# Patient Record
Sex: Female | Born: 1980 | Race: Black or African American | Hispanic: No | Marital: Single | Smoking: Never smoker
Health system: Southern US, Community
[De-identification: ages and names within clinical notes are randomized; demographics above are authoritative.]

## PROBLEM LIST (undated history)

## (undated) HISTORY — PX: DENTAL SURGERY: SHX609

---

## 2002-12-30 ENCOUNTER — Emergency Department (HOSPITAL_COMMUNITY): Admission: EM | Admit: 2002-12-30 | Discharge: 2002-12-30 | Payer: Self-pay | Admitting: *Deleted

## 2009-08-24 ENCOUNTER — Emergency Department (HOSPITAL_COMMUNITY): Admission: EM | Admit: 2009-08-24 | Discharge: 2009-08-24 | Payer: Self-pay | Admitting: Emergency Medicine

## 2009-09-01 ENCOUNTER — Emergency Department (HOSPITAL_COMMUNITY): Admission: EM | Admit: 2009-09-01 | Discharge: 2009-09-01 | Payer: Self-pay | Admitting: Emergency Medicine

## 2010-09-27 LAB — URINALYSIS, ROUTINE W REFLEX MICROSCOPIC
Hgb urine dipstick: NEGATIVE
Nitrite: NEGATIVE
Urobilinogen, UA: 0.2 mg/dL (ref 0.0–1.0)

## 2014-06-10 ENCOUNTER — Emergency Department (HOSPITAL_COMMUNITY)
Admission: EM | Admit: 2014-06-10 | Discharge: 2014-06-10 | Disposition: A | Discharge status: Home / Self Care | Payer: Self-pay | Attending: Emergency Medicine | Admitting: Emergency Medicine

## 2014-06-10 ENCOUNTER — Encounter (HOSPITAL_COMMUNITY): Payer: Self-pay | Admitting: Cardiology

## 2014-06-10 ENCOUNTER — Emergency Department (HOSPITAL_COMMUNITY): Payer: Self-pay

## 2014-06-10 DIAGNOSIS — R61 Generalized hyperhidrosis: Secondary | ICD-10-CM | POA: Insufficient documentation

## 2014-06-10 DIAGNOSIS — M549 Dorsalgia, unspecified: Secondary | ICD-10-CM | POA: Insufficient documentation

## 2014-06-10 DIAGNOSIS — R0602 Shortness of breath: Secondary | ICD-10-CM | POA: Insufficient documentation

## 2014-06-10 DIAGNOSIS — I493 Ventricular premature depolarization: Secondary | ICD-10-CM | POA: Insufficient documentation

## 2014-06-10 DIAGNOSIS — R079 Chest pain, unspecified: Secondary | ICD-10-CM | POA: Insufficient documentation

## 2014-06-10 LAB — CBC WITH DIFFERENTIAL/PLATELET
BASOS PCT: 0 % (ref 0–1)
Basophils Absolute: 0 10*3/uL (ref 0.0–0.1)
EOS ABS: 0.1 10*3/uL (ref 0.0–0.7)
Eosinophils Relative: 1 % (ref 0–5)
HEMATOCRIT: 37.6 % (ref 36.0–46.0)
HEMOGLOBIN: 13 g/dL (ref 12.0–15.0)
Lymphocytes Relative: 29 % (ref 12–46)
Lymphs Abs: 1.7 10*3/uL (ref 0.7–4.0)
MCH: 29 pg (ref 26.0–34.0)
MCHC: 34.6 g/dL (ref 30.0–36.0)
MCV: 83.7 fL (ref 78.0–100.0)
Monocytes Absolute: 0.5 10*3/uL (ref 0.1–1.0)
Monocytes Relative: 9 % (ref 3–12)
NEUTROS PCT: 61 % (ref 43–77)
Neutro Abs: 3.6 10*3/uL (ref 1.7–7.7)
PLATELETS: 263 10*3/uL (ref 150–400)
RBC: 4.49 MIL/uL (ref 3.87–5.11)
RDW: 13.1 % (ref 11.5–15.5)
WBC: 5.9 10*3/uL (ref 4.0–10.5)

## 2014-06-10 LAB — BASIC METABOLIC PANEL
Anion gap: 16 — ABNORMAL HIGH (ref 5–15)
BUN: 14 mg/dL (ref 6–23)
CHLORIDE: 101 meq/L (ref 96–112)
CO2: 23 mEq/L (ref 19–32)
Calcium: 10.1 mg/dL (ref 8.4–10.5)
Creatinine, Ser: 0.78 mg/dL (ref 0.50–1.10)
GFR calc Af Amer: >90 mL/min (ref >90)
GFR calc non Af Amer: >90 mL/min (ref >90)
Glucose, Bld: 85 mg/dL (ref 70–99)
Potassium: 3.5 mEq/L — ABNORMAL LOW (ref 3.7–5.3)
SODIUM: 140 meq/L (ref 137–147)

## 2014-06-10 LAB — D-DIMER, QUANTITATIVE (NOT AT ARMC): D DIMER QUANT: 0.39 ug{FEU}/mL (ref 0.00–0.48)

## 2014-06-10 LAB — TROPONIN I

## 2014-06-10 LAB — I-STAT BETA HCG BLOOD, ED (MC, WL, AP ONLY)

## 2014-06-10 LAB — HCG, SERUM, QUALITATIVE: PREG SERUM: NEGATIVE

## 2014-06-10 MED ORDER — HYDROXYZINE HCL 25 MG PO TABS: 25.0000 mg | ORAL_TABLET | Freq: Every evening | ORAL | Status: DC | PRN

## 2014-06-10 NOTE — ED Notes (Addendum)
Left sided breast pain off and on since thanksgiving.  States she feels like her left breast is larger than the right.

## 2014-06-10 NOTE — Discharge Instructions (Signed)
No caffeine. Call Cardiology office for appointment.  Holter Monitoring A Holter monitor is a small device with electrodes (small sticky patches) that attach to your chest. It records the electrical activity of your heart and is worn continuously for 24-48 hours.  A HOLTER MONITOR IS USED TO  Detect heart problems such as:  Heart arrhythmia. Is an abnormal or irregular heartbeat. With some heart arrhythmias, you may not feel or know that you have an irregular heart rhythm.  Palpitations, such as feeling your heart racing or fluttering. It is possible to have heart palpitations and not have a heart arrhythmia.  A heart rhythm that is too slow or too fast.  If you have problems fainting, near fainting or feeling light-headed, a Holter monitor may be worn to see if your heart is the cause. HOLTER MONITOR PREPARATION   Electrodes will be attached to the skin on your chest.  If you have hair on your chest, small areas may have to be shaved. This is done to help the patches stick better and make the recording more accurate.  The electrodes are attached by wires to the Holter monitor. The Holter monitor clips to your clothing. You will wear the monitor at all times, even while exercising and sleeping. HOME CARE INSTRUCTIONS   Wear your monitor at all times.  The wires and the monitor must stay dry. Do not get the monitor wet.  Do not bathe, swim or use a hot tub with it on.  You may do a "sponge" bath while you have the monitor on.  Keep your skin clean, do not put body lotion or moisturizer on your chest.  It's possible that your skin under the electrodes could become irritated. To keep this from happening, you may put the electrodes in slightly different places on your chest.  Your caregiver will also ask you to keep a diary of your activities, such as walking or doing chores. Be sure to note what you are doing if you experience heart symptoms such as palpitations. This will help your  caregiver determine what might be contributing to your symptoms. The information stored in your monitor will be reviewed by your caregiver alongside your diary entries.  Make sure the monitor is safely clipped to your clothing or in a location close to your body that your caregiver recommends.  The monitor and electrodes are removed when the test is over. Return the monitor as directed.  Be sure to follow up with your caregiver and discuss your Holter monitor results. SEEK IMMEDIATE MEDICAL CARE IF:  You faint or feel lightheaded.  You have trouble breathing.  You get pain in your chest, upper arm or jaw.  You feel sick to your stomach and your skin is pale, cool, or damp.  You think something is wrong with the way your heart is beating. MAKE SURE YOU:   Understand these instructions.  Will watch your condition.  Will get help right away if you are not doing well or get worse. Document Released: 03/23/2004 Document Revised: 09/17/2011 Document Reviewed: 08/05/2008 [facility] CenterExitCare Patient Information 2015 [place], [place]. This information is not intended to replace advice given to you by your health care provider. Make sure you discuss any questions you have with your health care provider.  Premature Beats A premature beat is an extra heartbeat that happens earlier than normal. Premature beats are called premature atrial contractions (PACs) or premature ventricular contractions (PVCs) depending on the area of the heart where they start. CAUSES  Premature beats may be brought on by a variety of factors including:  Emotional stress.  Lack of sleep.  Caffeine.  Asthma medicines.  Stimulants.  Herbal teas.  Dietary supplements.  Alcohol. In most cases, premature beats are not dangerous and are not a sign of serious heart disease. Most patients evaluated for premature beats have completely normal heart function. Rarely, premature beats may be a sign of more significant heart  problems or medical illness. SYMPTOMS  Premature beats may cause palpitations. This means you feel like your heart is skipping a beat or beating harder than usual. Sometimes, slight chest pain occurs with premature beats, lasting only a few seconds. This pain has been described as a "flopping" feeling inside the chest. In many cases, premature beats do not cause any symptoms and they are only detected when an electrocardiography test (EKG) or heart monitoring is performed. DIAGNOSIS  Your caregiver may run some tests to evaluate your heart such as an EKG or echocardiography. You may need to wear a portable heart monitor for several days to record the electrical activity of your heart. Blood testing may also be performed to check your electrolytes and thyroid function. TREATMENT  Premature beats usually go away with rest. If the problem continues, your caregiver will determine a treatment plan for you.  HOME CARE INSTRUCTIONS  Get plenty of rest over the next few days until your symptoms improve.  Avoid coffee, tea, alcohol, and soda (pop, cola).  Do not smoke. SEEK MEDICAL CARE IF:  Your symptoms continue after 1 to 2 days of rest.  You have new symptoms, such as chest pain or trouble breathing. SEEK IMMEDIATE MEDICAL CARE IF:  You have severe chest pain or abdominal pain.  You have pain that radiates into the neck, arm, or jaw.  You faint or have extreme weakness.  You have shortness of breath.  Your heartbeat races for more than 5 seconds. MAKE SURE YOU:  Understand these instructions.  Will watch your condition.  Will get help right away if you are not doing well or get worse. Document Released: 08/02/2004 Document Revised: 09/17/2011 Document Reviewed: 02/26/2011 [facility] Patient Information 2015 [place], [place]. This information is not intended to replace advice given to you by your health care provider. Make sure you discuss any questions you have with your health care  provider.  Premature Ventricular Contraction Premature ventricular contraction (PVC) is an irregularity of the heart rhythm involving extra or skipped heartbeats. In some cases, they may occur without obvious cause or heart disease. Other times, they can be caused by an electrolyte change in the blood. These need to be corrected. They can also be seen when there is not enough oxygen going to the heart. A common cause of this is plaque or cholesterol buildup. This buildup decreases the blood supply to the heart. In addition, extra beats may be caused or aggravated by:  Excessive smoking.  Alcohol consumption.  Caffeine.  Certain medications  Some street drugs. SYMPTOMS   The sensation of feeling your heart skipping a beat (palpitations).  In many cases, the person may have no symptoms. SIGNS AND TESTS   A physical examination may show an occasional irregularity, but if the PVC beats do not happen often, they may not be found on physical exam.  Blood pressure is usually normal.  Other tests that may find extra beats of the heart are:  An EKG (electrocardiogram)  A Holter monitor which can monitor your heart over longer periods of time  An Angiogram (study of the heart arteries). TREATMENT  Usually extra heartbeats do not need treatment. The condition is treated only if symptoms are severe or if extra beats are very frequent or are causing problems. An underlying cause, if discovered, may also require treatment.  Treatment may also be needed if there may be a risk for other more serious cardiac arrhythmias.  PREVENTION   Moderation in caffeine, alcohol, and tobacco use may reduce the risk of ectopic heartbeats in some people.  Exercise often helps people who lead a sedentary (inactive) lifestyle. PROGNOSIS  PVC heartbeats are generally harmless and do not need treatment.  RISKS AND COMPLICATIONS   Ventricular tachycardia (occasionally).  There usually are no  complications.  Other arrhythmias (occasionally). SEEK IMMEDIATE MEDICAL CARE IF:   You feel palpitations that are frequent or continual.  You develop chest pain or other problems such as shortness of breath, sweating, or nausea and vomiting.  You become light-headed or faint (pass out).  You get worse or do not improve with treatment. Document Released: 02/10/2004 Document Revised: 09/17/2011 Document Reviewed: 08/22/2007 [facility]Exit[facility] Patient Information 2015 [place], [place]. This information is not intended to replace advice given to you by your health care provider. Make sure you discuss any questions you have with your health care provider.

## 2014-06-10 NOTE — ED Provider Notes (Signed)
CSN: [id number]     Arrival date & time 06/10/14  1823 History  This chart was scribed for Rolland PorterMark Suni Jarnagin, MD by Gwenyth Oberatherine Macek, ED Scribe. This patient was seen in room APA05/AP[facility]05 and the patient's care was started at 6:53 PM.    Chief Complaint  Patient presents with  . Chest Pain   The history is provided by the patient. No language interpreter was used.    HPI Comments: Autumn Stewart is a 33 y.o. female who presents to the Emergency Department complaining of constant chest palpitations that she describes as irregular and started 1 week ago. She notes SOB with exertion and diaphoresis at night as associated symptoms. Pt denies taking prescription medications, birth control use, a history of asthma, recent drug abuse and a family history of CAD. She states she used to smoke, but quit 1 month ago. She notes occasional, but infrequent caffeine consumption. Pt has been under a lot of stress recently and has had decreased sleep in the last 4 days.   Pt also complains of intermittent left breast pain that started 1 week ago and intermittent, mild upper back pain that started 1 month ago. She states that she sleeps on her left side and that pain seems to decrease during the day. She denies a history of breast issues or masses in her breast. She also denies nausea, vomiting and fever as associated symptoms.   History reviewed. No pertinent past medical history. History reviewed. No pertinent past surgical history. History reviewed. No pertinent family history. History  Substance Use Topics  . Smoking status: Never Smoker   . Smokeless tobacco: Not on file  . Alcohol Use: No   OB History    No data available     Review of Systems  Constitutional: Positive for diaphoresis. Negative for fever, chills, appetite change and fatigue.  HENT: Negative for mouth sores, sore throat and trouble swallowing.   Eyes: Negative for visual disturbance.  Respiratory: Positive for shortness of breath. Negative  for cough, chest tightness and wheezing.   Cardiovascular: Positive for palpitations. Negative for chest pain.  Gastrointestinal: Negative for nausea, vomiting, abdominal pain, diarrhea and abdominal distention.  Endocrine: Negative for polydipsia, polyphagia and polyuria.  Genitourinary: Negative for dysuria, frequency and hematuria.  Musculoskeletal: Positive for back pain and arthralgias. Negative for gait problem.  Skin: Negative for color change, pallor and rash.  Neurological: Negative for dizziness, syncope, light-headedness and headaches.  Hematological: Does not bruise/bleed easily.  Psychiatric/Behavioral: Negative for behavioral problems and confusion.    Allergies  Review of patient's allergies indicates no known allergies.  Home Medications   Prior to Admission medications   Medication Sig Start Date End Date Taking? Authorizing Provider  ibuprofen (ADVIL,MOTRIN) 200 MG tablet Take 200 mg by mouth every 6 (six) hours as needed for mild pain or moderate pain.   Yes Historical Provider, MD  hydrOXYzine (ATARAX/VISTARIL) 25 MG tablet Take 1 tablet (25 mg total) by mouth at bedtime as needed (sleep). 06/10/14   Rolland PorterMark Kiante Petrovich, MD   BP 142/98 mmHg  Pulse 82  Temp(Src) 98.2 F (36.8 C) (Oral)  Resp 15  Ht 5\' 4"  (1.626 m)  Wt 116 lb (52.617 kg)  BMI 19.90 kg/m2  SpO2 100%  LMP 05/11/2014   Physical Exam  Constitutional: She is oriented to person, place, and time. She appears well-developed and well-nourished. No distress.  HENT:  Head: Normocephalic.  Eyes: Conjunctivae are normal. Pupils are equal, round, and reactive to light. No scleral  icterus.  Neck: Normal range of motion. Neck supple. No thyromegaly present.  Cardiovascular: Normal rate.  Exam reveals no gallop and no friction rub.   No murmur heard. Occasional irregular beat with trigeminy  Pulmonary/Chest: Effort normal and breath sounds normal. No respiratory distress. She has no wheezes. She has no rales.   Abdominal: Soft. Bowel sounds are normal. She exhibits no distension. There is no tenderness. There is no rebound.  Musculoskeletal: Normal range of motion. She exhibits tenderness.  Infrascapular tenderness  Neurological: She is alert and oriented to person, place, and time.  Skin: Skin is warm and dry. No rash noted.  Psychiatric: She has a normal mood and affect. Her behavior is normal.  Nursing note and vitals reviewed.  Symmetric breast exam done with chaperone in the room. No masses no discharge no asymmetry no erythema nontender.  ED Course  Procedures (including critical care time) DIAGNOSTIC STUDIES: Oxygen Saturation is 100% on RA, normal by my interpretation.    COORDINATION OF CARE: 7:02 PM Discussed treatment plan with pt which includes lab work. Pt agreed to plan.  Labs Review Labs Reviewed  BASIC METABOLIC PANEL - Abnormal; Notable for the following:    Potassium 3.5 (*)    Anion gap 16 (*)    All other components within normal limits  CBC WITH DIFFERENTIAL  D-DIMER, QUANTITATIVE  TROPONIN I  HCG, SERUM, QUALITATIVE  TSH  I-STAT BETA HCG BLOOD, ED ([facility], [facility], [facility] ONLY)    Imaging Review No results found.   EKG Interpretation   Date/Time:  Thursday June 10 2014 18:45:56 EST Ventricular Rate:  80 PR Interval:  138 QRS Duration: 71 QT Interval:  364 QTC Calculation: 420 R Axis:   82 Text Interpretation:  Sinus rhythm Ventricular trigeminy Confirmed by  Fayrene FearingJAMES  MD, Yenty Bloch ([id number]) on 06/10/2014 7:58:24 PM      MDM   Final diagnoses:  PVC's (premature ventricular contractions)    Patient states she's been stressed because she been trying to find a job. That's why she stopped smoking. Denies over-the-counter stimulants with cough or cold medicine. Denies illicit drug use. She is joint not slept well for 5 or 6 days and her PVCs have gotten more frequent. Given her Atarax to use for sleep. With our cardiology for follow-up and possible Holter monitoring.  Recheck with any worsening symptoms.  I personally performed the services described in this documentation, which was scribed in my presence. The recorded information has been reviewed and is accurate.    Rolland PorterMark Cree Kunert, MD 06/10/14 2020

## 2014-06-11 LAB — TSH: TSH: 1.5 u[IU]/mL (ref 0.350–4.500)

## 2014-06-23 ENCOUNTER — Encounter: Payer: Self-pay | Admitting: Cardiology

## 2014-06-23 DIAGNOSIS — I493 Ventricular premature depolarization: Secondary | ICD-10-CM | POA: Insufficient documentation

## 2014-06-23 DIAGNOSIS — R002 Palpitations: Secondary | ICD-10-CM | POA: Insufficient documentation

## 2014-06-23 NOTE — Progress Notes (Signed)
No show  This encounter was created in error - please disregard.

## 2014-10-13 ENCOUNTER — Emergency Department (HOSPITAL_COMMUNITY)
Admission: EM | Admit: 2014-10-13 | Discharge: 2014-10-13 | Disposition: A | Discharge status: Home / Self Care | Payer: Self-pay | Attending: Emergency Medicine | Admitting: Emergency Medicine

## 2014-10-13 ENCOUNTER — Encounter (HOSPITAL_COMMUNITY): Payer: Self-pay | Admitting: Emergency Medicine

## 2014-10-13 DIAGNOSIS — Z79899 Other long term (current) drug therapy: Secondary | ICD-10-CM | POA: Insufficient documentation

## 2014-10-13 DIAGNOSIS — K047 Periapical abscess without sinus: Secondary | ICD-10-CM | POA: Insufficient documentation

## 2014-10-13 DIAGNOSIS — Z791 Long term (current) use of non-steroidal anti-inflammatories (NSAID): Secondary | ICD-10-CM | POA: Insufficient documentation

## 2014-10-13 DIAGNOSIS — K029 Dental caries, unspecified: Secondary | ICD-10-CM | POA: Insufficient documentation

## 2014-10-13 MED ORDER — TRAMADOL HCL 50 MG PO TABS: 50.0000 mg | ORAL_TABLET | Freq: Four times a day (QID) | ORAL | Status: DC | PRN

## 2014-10-13 MED ORDER — AMOXICILLIN 500 MG PO CAPS: 500.0000 mg | ORAL_CAPSULE | Freq: Three times a day (TID) | ORAL | Status: DC

## 2014-10-13 NOTE — Discharge Instructions (Signed)
Dental Abscess °A dental abscess is a collection of infected fluid (pus) from a bacterial infection in the inner part of the tooth (pulp). It usually occurs at the end of the tooth's root.  °CAUSES  °· Severe tooth decay. °· Trauma to the tooth that allows bacteria to enter into the pulp, such as a broken or chipped tooth. °SYMPTOMS  °· Severe pain in and around the infected tooth. °· Swelling and redness around the abscessed tooth or in the mouth or face. °· Tenderness. °· Pus drainage. °· Bad breath. °· Bitter taste in the mouth. °· Difficulty swallowing. °· Difficulty opening the mouth. °· Nausea. °· Vomiting. °· Chills. °· Swollen neck glands. °DIAGNOSIS  °· A medical and dental history will be taken. °· An examination will be performed by tapping on the abscessed tooth. °· X-rays may be taken of the tooth to identify the abscess. °TREATMENT °The goal of treatment is to eliminate the infection. You may be prescribed antibiotic medicine to stop the infection from spreading. A root canal may be performed to save the tooth. If the tooth cannot be saved, it may be pulled (extracted) and the abscess may be drained.  °HOME CARE INSTRUCTIONS °· Only take over-the-counter or prescription medicines for pain, fever, or discomfort as directed by your caregiver. °· Rinse your mouth (gargle) often with salt water (¼ tsp salt in 8 oz [250 ml] of warm water) to relieve pain or swelling. °· Do not drive after taking pain medicine (narcotics). °· Do not apply heat to the outside of your face. °· Return to your dentist for further treatment as directed. °SEEK MEDICAL CARE IF: °· Your pain is not helped by medicine. °· Your pain is getting worse instead of better. °SEEK IMMEDIATE MEDICAL CARE IF: °· You have a fever or persistent symptoms for more than 2-3 days. °· You have a fever and your symptoms suddenly get worse. °· You have chills or a very bad headache. °· You have problems breathing or swallowing. °· You have trouble  opening your mouth. °· You have swelling in the neck or around the eye. °Document Released: 06/25/2005 Document Revised: 03/19/2012 Document Reviewed: 10/03/2010 °ExitCare® Patient Information ©2015 [employer]. This information is not intended to replace advice given to you by your health care provider. Make sure you discuss any questions you have with your health care provider. ° ° °Complete your entire course of antibiotics as prescribed.  You  may use the tramadol for pain relief but do not drive within 4 hours of taking as this will make you drowsy.  Avoid applying heat or ice to this abscess area which can worsen your symptoms.  You may use warm salt water swish and spit treatment or half peroxide and water swish and spit after meals to keep this area clean as discussed.  Call the dentist listed above for further management of your symptoms. ° ° °

## 2014-10-13 NOTE — ED Provider Notes (Signed)
CSN: [id number]     Arrival date & time 10/13/14  1446 History   First MD Initiated Contact with Patient 10/13/14 1512     Chief Complaint  Patient presents with  . Dental Pain     (Consider location/radiation/quality/duration/timing/severity/associated sxs/prior Treatment) Patient is a 34 y.o. female presenting with tooth pain. The history is provided by the patient.  Dental Pain Location:  Lower and upper Upper teeth location:  2/RU 2nd molar Lower teeth location:  30/RL 1st molar and 18/LL 2nd molar Quality:  Aching Severity:  Moderate Onset quality:  Gradual Duration:  2 weeks Timing:  Constant Progression:  Worsening Chronicity:  Recurrent Context: dental caries and poor dentition   Previous work-up:  Dental exam (has had several extractions when was covered by insurance, now unable to see dentistry.) Relieved by:  Nothing Worsened by:  Nothing tried Ineffective treatments:  Acetaminophen and NSAIDs Associated symptoms: gum swelling   Associated symptoms: no facial swelling, no fever, no neck pain, no neck swelling, no oral bleeding and no trismus   Associated symptoms comment:  Drainage from site along right lower gum line which drains pus when pressed.   History reviewed. No pertinent past medical history. History reviewed. No pertinent past surgical history. Family History  Problem Relation Age of Onset  . Diabetes Other   . Hypertension Other    History  Substance Use Topics  . Smoking status: Never Smoker   . Smokeless tobacco: Never Used  . Alcohol Use: No   OB History    No data available     Review of Systems  Constitutional: Negative for fever.  HENT: Positive for dental problem. Negative for facial swelling and sore throat.   Respiratory: Negative for shortness of breath.   Musculoskeletal: Negative for neck pain and neck stiffness.      Allergies  Review of patient's allergies indicates no known allergies.  Home Medications   Prior to  Admission medications   Medication Sig Start Date End Date Taking? Authorizing Provider  ibuprofen (ADVIL,MOTRIN) 200 MG tablet Take 200 mg by mouth every 6 (six) hours as needed for mild pain or moderate pain.   Yes Historical Provider, MD  naproxen sodium (ANAPROX) 220 MG tablet Take 440 mg by mouth daily as needed (pain).   Yes Historical Provider, MD  amoxicillin (AMOXIL) 500 MG capsule Take 1 capsule (500 mg total) by mouth 3 (three) times daily. 10/13/14   Burgess Amor, PA-C  hydrOXYzine (ATARAX/VISTARIL) 25 MG tablet Take 1 tablet (25 mg total) by mouth at bedtime as needed (sleep). Patient not taking: Reported on 10/13/2014 06/10/14   Rolland Porter, MD  traMADol (ULTRAM) 50 MG tablet Take 1 tablet (50 mg total) by mouth every 6 (six) hours as needed. 10/13/14   Burgess Amor, PA-C   BP 135/81 mmHg  Pulse 73  Temp(Src) 98.7 F (37.1 C) (Oral)  Resp 18  Ht  (1.626 m)  Wt 120 lb (54.432 kg)  BMI 20.59 kg/m2  SpO2 100%  LMP 09/29/2014 Physical Exam  Constitutional: She is oriented to person, place, and time. She appears well-developed and well-nourished. No distress.  HENT:  Head: Normocephalic and atraumatic.  Right Ear: Tympanic membrane and external ear normal.  Left Ear: Tympanic membrane and external ear normal.  Mouth/Throat: Oropharynx is clear and moist and mucous membranes are normal. No oral lesions. Dental caries present.    Eyes: Conjunctivae are normal.  Neck: Normal range of motion. Neck supple.  Cardiovascular: Normal rate and  normal heart sounds.   Pulmonary/Chest: Effort normal.  Abdominal: She exhibits no distension.  Musculoskeletal: Normal range of motion.  Lymphadenopathy:    She has no cervical adenopathy.  Neurological: She is alert and oriented to person, place, and time.  Skin: Skin is warm and dry. No erythema.  Psychiatric: She has a normal mood and affect.    ED Course  Procedures (including critical care time) Labs Review Labs Reviewed - No data to  display  Imaging Review No results found.   EKG Interpretation None      MDM   Final diagnoses:  Dental abscess  Dental decay    Pt with complicated dental pain/cavities (multiple) with purulent drainage from gingiva.  Amoxil,  Tramadol, dental referrals given. she has already contacted the free clinic, will also give her the health department is another [facility] resource.    Burgess AmorJulie Fletcher Rathbun, PA-C 10/13/14 1540  Kristen N Ward, DO 10/13/14 1754

## 2014-10-13 NOTE — ED Notes (Signed)
Pt reports lower jaw dental pain x 2 1/2 weeks.

## 2014-12-07 ENCOUNTER — Encounter (HOSPITAL_COMMUNITY): Payer: Self-pay | Admitting: Emergency Medicine

## 2014-12-07 ENCOUNTER — Emergency Department (HOSPITAL_COMMUNITY)
Admission: EM | Admit: 2014-12-07 | Discharge: 2014-12-07 | Disposition: A | Discharge status: Home / Self Care | Payer: Self-pay | Attending: Emergency Medicine | Admitting: Emergency Medicine

## 2014-12-07 DIAGNOSIS — K029 Dental caries, unspecified: Secondary | ICD-10-CM | POA: Insufficient documentation

## 2014-12-07 DIAGNOSIS — Z792 Long term (current) use of antibiotics: Secondary | ICD-10-CM | POA: Insufficient documentation

## 2014-12-07 DIAGNOSIS — K088 Other specified disorders of teeth and supporting structures: Secondary | ICD-10-CM | POA: Insufficient documentation

## 2014-12-07 DIAGNOSIS — K0889 Other specified disorders of teeth and supporting structures: Secondary | ICD-10-CM

## 2014-12-07 MED ORDER — ACETAMINOPHEN-CODEINE #3 300-30 MG PO TABS: 1.0000 | ORAL_TABLET | Freq: Four times a day (QID) | ORAL | Status: DC | PRN

## 2014-12-07 MED ORDER — AMOXICILLIN 500 MG PO CAPS: 500.0000 mg | ORAL_CAPSULE | Freq: Three times a day (TID) | ORAL | Status: DC

## 2014-12-07 NOTE — ED Notes (Signed)
Pt has broken tooth on lower right side causing pain

## 2014-12-07 NOTE — ED Notes (Signed)
Pt verbalized understanding of no driving and to use caution within 4 hours of taking pain meds due to meds cause drowsiness 

## 2014-12-07 NOTE — ED Provider Notes (Signed)
CSN: [id number]     Arrival date & time 12/07/14  1448 History   First MD Initiated Contact with Patient 12/07/14 1516     Chief Complaint  Patient presents with  . Dental Pain     (Consider location/radiation/quality/duration/timing/severity/associated sxs/prior Treatment) HPI  Autumn Stewart is a 34 y.o. female who presents to the Emergency Department complaining of dental pain for several days.  She reports pain to her right lower teeth.  She also reports intermittent bleeding to her gums and a "bad tasting drainage"  She denies fever, chills, and facial swelling. She states that she doesn't have the money to afford to see a dentist.  She has not taken any medications for pain.      History reviewed. No pertinent past medical history. History reviewed. No pertinent past surgical history. Family History  Problem Relation Age of Onset  . Diabetes Other   . Hypertension Other    History  Substance Use Topics  . Smoking status: Never Smoker   . Smokeless tobacco: Never Used  . Alcohol Use: No   OB History    No data available     Review of Systems  Constitutional: Negative for fever and appetite change.  HENT: Positive for dental problem. Negative for congestion, facial swelling, sore throat and trouble swallowing.   Eyes: Negative for pain and visual disturbance.  Gastrointestinal: Negative for nausea and vomiting.  Musculoskeletal: Negative for neck pain and neck stiffness.  Neurological: Negative for dizziness, facial asymmetry and headaches.  Hematological: Negative for adenopathy.  All other systems reviewed and are negative.     Allergies  Review of patient's allergies indicates no known allergies.  Home Medications   Prior to Admission medications   Medication Sig Start Date End Date Taking? Authorizing Provider  amoxicillin (AMOXIL) 500 MG capsule Take 1 capsule (500 mg total) by mouth 3 (three) times daily. 10/13/14   Burgess AmorJulie Idol, PA-C  hydrOXYzine  (ATARAX/VISTARIL) 25 MG tablet Take 1 tablet (25 mg total) by mouth at bedtime as needed (sleep). Patient not taking: Reported on 10/13/2014 06/10/14   Rolland PorterMark James, MD  ibuprofen (ADVIL,MOTRIN) 200 MG tablet Take 200 mg by mouth every 6 (six) hours as needed for mild pain or moderate pain.    Historical Provider, MD  naproxen sodium (ANAPROX) 220 MG tablet Take 440 mg by mouth daily as needed (pain).    Historical Provider, MD  traMADol (ULTRAM) 50 MG tablet Take 1 tablet (50 mg total) by mouth every 6 (six) hours as needed. 10/13/14   Burgess AmorJulie Idol, PA-C   BP 123/80 mmHg  Pulse 69  Temp(Src) 98.3 F (36.8 C) (Oral)  Resp 12  SpO2 99%  LMP 11/06/2014 Physical Exam  Constitutional: She is oriented to person, place, and time. She appears well-developed and well-nourished. No distress.  HENT:  Head: Normocephalic and atraumatic.  Right Ear: Tympanic membrane and ear canal normal.  Left Ear: Tympanic membrane and ear canal normal.  Mouth/Throat: Uvula is midline, oropharynx is clear and moist and mucous membranes are normal. No trismus in the jaw. Dental caries present. No dental abscesses or uvula swelling.    tenderness to palpation at the right lower second premolar and multiple dental caries.   No facial swelling, obvious dental abscess, trismus, or sublingual abnml.    Neck: Normal range of motion. Neck supple.  Cardiovascular: Normal rate, regular rhythm and normal heart sounds.   No murmur heard. Pulmonary/Chest: Effort normal and breath sounds normal.  Musculoskeletal: Normal  range of motion.  Lymphadenopathy:    She has no cervical adenopathy.  Neurological: She is alert and oriented to person, place, and time. She exhibits normal muscle tone. Coordination normal.  Skin: Skin is warm and dry.  Nursing note and vitals reviewed.   ED Course  Procedures (including critical care time) Labs Review Labs Reviewed - No data to display  Imaging Review No results found.   EKG  Interpretation None      MDM   Final diagnoses:  Pain, dental   Pt is well appearing.  Non-toxic.  No concerning sx's for Ludwig's angina.  Pt given referral info.  Agrees to arrange f/u     Pauline Aus, PA-C 12/07/14 1630  Vanetta Mulders, MD 12/09/14 [phone]

## 2014-12-07 NOTE — Discharge Instructions (Signed)
Dental Pain  Toothache is pain in or around a tooth. It may get worse with chewing or with cold or heat.   HOME CARE  · Your dentist may use a numbing medicine during treatment. If so, you may need to avoid eating until the medicine wears off. Ask your dentist about this.  · Only take medicine as told by your dentist or doctor.  · Avoid chewing food near the painful tooth until after all treatment is done. Ask your dentist about this.  GET HELP RIGHT AWAY IF:   · The problem gets worse or new problems appear.  · You have a fever.  · There is redness and puffiness (swelling) of the face, jaw, or neck.  · You cannot open your mouth.  · There is pain in the jaw.  · There is very bad pain that is not helped by medicine.  MAKE SURE YOU:   · Understand these instructions.  · Will watch your condition.  · Will get help right away if you are not doing well or get worse.  Document Released: 12/12/2007 Document Revised: 09/17/2011 Document Reviewed: 12/12/2007  [employer]® Patient Information ©2015 [employer]. This information is not intended to replace advice given to you by your health care provider. Make sure you discuss any questions you have with your health care provider.

## 2015-12-27 ENCOUNTER — Emergency Department (HOSPITAL_COMMUNITY)
Admission: EM | Admit: 2015-12-27 | Discharge: 2015-12-27 | Disposition: A | Discharge status: Home / Self Care | Payer: Self-pay | Attending: Emergency Medicine | Admitting: Emergency Medicine

## 2015-12-27 ENCOUNTER — Encounter (HOSPITAL_COMMUNITY): Payer: Self-pay | Admitting: Emergency Medicine

## 2015-12-27 DIAGNOSIS — M545 Low back pain, unspecified: Secondary | ICD-10-CM

## 2015-12-27 DIAGNOSIS — R51 Headache: Secondary | ICD-10-CM | POA: Insufficient documentation

## 2015-12-27 DIAGNOSIS — Z791 Long term (current) use of non-steroidal anti-inflammatories (NSAID): Secondary | ICD-10-CM | POA: Insufficient documentation

## 2015-12-27 DIAGNOSIS — Z79899 Other long term (current) drug therapy: Secondary | ICD-10-CM | POA: Insufficient documentation

## 2015-12-27 DIAGNOSIS — K029 Dental caries, unspecified: Secondary | ICD-10-CM | POA: Insufficient documentation

## 2015-12-27 DIAGNOSIS — K0889 Other specified disorders of teeth and supporting structures: Secondary | ICD-10-CM

## 2015-12-27 MED ORDER — AMOXICILLIN 500 MG PO CAPS: 500.0000 mg | ORAL_CAPSULE | Freq: Three times a day (TID) | ORAL | Status: AC

## 2015-12-27 MED ORDER — DICLOFENAC SODIUM 50 MG PO TBEC: 50.0000 mg | DELAYED_RELEASE_TABLET | Freq: Two times a day (BID) | ORAL | Status: AC

## 2015-12-27 NOTE — ED Notes (Signed)
PT c/o right lower dental pain x3 days.

## 2015-12-27 NOTE — Discharge Instructions (Signed)
Back Pain, Adult °Back pain is very common in adults. The cause of back pain is rarely dangerous and the pain often gets better over time. The cause of your back pain may not be known. Some common causes of back pain include: °· Strain of the muscles or ligaments supporting the spine. °· Wear and tear (degeneration) of the spinal disks. °· Arthritis. °· Direct injury to the back. °For many people, back pain may return. Since back pain is rarely dangerous, most people can learn to manage this condition on their own. °HOME CARE INSTRUCTIONS °Watch your back pain for any changes. The following actions may help to lessen any discomfort you are feeling: °· Remain active. It is stressful on your back to sit or stand in one place for long periods of time. Do not sit, drive, or stand in one place for more than 30 minutes at a time. Take short walks on even surfaces as soon as you are able. Try to increase the length of time you walk each day. °· Exercise regularly as directed by your health care provider. Exercise helps your back heal faster. It also helps avoid future injury by keeping your muscles strong and flexible. °· Do not stay in bed. Resting more than 1-2 days can delay your recovery. °· Pay attention to your body when you bend and lift. The most comfortable positions are those that put less stress on your recovering back. Always use proper lifting techniques, including: °· Bending your knees. °· Keeping the load close to your body. °· Avoiding twisting. °· Find a comfortable position to sleep. Use a firm mattress and lie on your side with your knees slightly bent. If you lie on your back, put a pillow under your knees. °· Avoid feeling anxious or stressed. Stress increases muscle tension and can worsen back pain. It is important to recognize when you are anxious or stressed and learn ways to manage it, such as with exercise. °· Take medicines only as directed by your health care provider. Over-the-counter  medicines to reduce pain and inflammation are often the most helpful. Your health care provider may prescribe muscle relaxant drugs. These medicines help dull your pain so you can more quickly return to your normal activities and healthy exercise. °· Apply ice to the injured area: °· Put ice in a plastic bag. °· Place a towel between your skin and the bag. °· Leave the ice on for 20 minutes, 2-3 times a day for the first 2-3 days. After that, ice and heat may be alternated to reduce pain and spasms. °· Maintain a healthy weight. Excess weight puts extra stress on your back and makes it difficult to maintain good posture. °SEEK MEDICAL CARE IF: °· You have pain that is not relieved with rest or medicine. °· You have increasing pain going down into the legs or buttocks. °· You have pain that does not improve in one week. °· You have night pain. °· You lose weight. °· You have a fever or chills. °SEEK IMMEDIATE MEDICAL CARE IF:  °· You develop new bowel or bladder control problems. °· You have unusual weakness or numbness in your arms or legs. °· You develop nausea or vomiting. °· You develop abdominal pain. °· You feel faint. °  °This information is not intended to replace advice given to you by your health care provider. Make sure you discuss any questions you have with your health care provider. °  °Document Released: 06/25/2005 Document Revised: 07/16/2014 Document Reviewed: 10/27/2013 °Elsevier Interactive Patient Education ©2016 Else[employer] Dental Pain Dental pain may be caused by many things, including:  Tooth decay (cavities or caries). Cavities expose the nerve of your tooth to air and hot or cold temperatures. This can cause pain or discomfort.  Abscess or infection. A dental abscess is a collection of infected pus from a bacterial infection in the inner part of the tooth (pulp). It usually occurs at the end of the tooth's root.  Injury.  An unknown reason (idiopathic). Your pain may be mild or  severe. It may only occur when:  You are chewing.  You are exposed to hot or cold temperature.  You are eating or drinking sugary foods or beverages, such as soda or candy. Your pain may also be constant. HOME CARE INSTRUCTIONS Watch your dental pain for any changes. The following actions may help to lessen any discomfort that you are feeling:  Take medicines only as directed by your dentist.  If you were prescribed an antibiotic medicine, finish all of it even if you start to feel better.  Keep all follow-up visits as directed by your dentist. This is important.  Do not apply heat to the outside of your face.  Rinse your mouth or gargle with salt water if directed by your dentist. This helps with pain and swelling.  You can make salt water by adding  tsp of salt to 1 cup of warm water.  Apply ice to the painful area of your face:  Put ice in a plastic bag.  Place a towel between your skin and the bag.  Leave the ice on for 20 minutes, 2-3 times per day.  Avoid foods or drinks that cause you pain, such as:  Very hot or very cold foods or drinks.  Sweet or sugary foods or drinks. SEEK MEDICAL CARE IF:  Your pain is not controlled with medicines.  Your symptoms are worse.  You have new symptoms. SEEK IMMEDIATE MEDICAL CARE IF:  You are unable to open your mouth.  You are having trouble breathing or swallowing.  You have a fever.  Your face, neck, or jaw is swollen.   This information is not intended to replace advice given to you by your health care provider. Make sure you discuss any questions you have with your health care provider.   Document Released: 06/25/2005 Document Revised: 11/09/2014 Document Reviewed: 06/21/2014 Elsevier Interactive Patient Education [employer]2016 [employer] Inc[employer]

## 2015-12-27 NOTE — ED Provider Notes (Signed)
CSN: [id number]     Arrival date & time 12/27/15  1716 History   First MD Initiated Contact with Patient 12/27/15 1802     Chief Complaint  Patient presents with  . Dental Pain     (Consider location/radiation/quality/duration/timing/severity/associated sxs/prior Treatment) Patient is a 35 y.o. female presenting with tooth pain. The history is provided by the patient. No language interpreter was used.  Dental Pain Location:  Lower Lower teeth location:  19/LL 1st molar, 30/RL 1st molar, 29/RL 2nd bicuspid and 18/LL 2nd molar Quality:  Aching Severity:  Moderate Onset quality:  Gradual Duration:  3 days Timing:  Constant Progression:  Worsening Chronicity:  New Context: abscess   Relieved by:  Nothing Worsened by:  Nothing tried Ineffective treatments:  None tried Associated symptoms: facial pain   Pt also complains of low back.  Pt reports she turns and twist while working  History reviewed. No pertinent past medical history. Past Surgical History  Procedure Laterality Date  . Dental surgery     Family History  Problem Relation Age of Onset  . Diabetes Other   . Hypertension Other    Social History  Substance Use Topics  . Smoking status: Never Smoker   . Smokeless tobacco: Never Used  . Alcohol Use: No   OB History    No data available     Review of Systems  All other systems reviewed and are negative.     Allergies  Review of patient's allergies indicates no known allergies.  Home Medications   Prior to Admission medications   Medication Sig Start Date End Date Taking? Authorizing Provider  acetaminophen (TYLENOL) 500 MG tablet Take 500 mg by mouth every 6 (six) hours as needed for mild pain or moderate pain.   Yes Historical Provider, MD  ibuprofen (ADVIL,MOTRIN) 200 MG tablet Take 200 mg by mouth every 6 (six) hours as needed for fever, mild pain or moderate pain.   Yes Historical Provider, MD  amoxicillin (AMOXIL) 500 MG capsule Take 1 capsule (500  mg total) by mouth 3 (three) times daily. 12/27/15   Elson AreasLeslie K Sofia, PA-C  diclofenac (VOLTAREN) 50 MG EC tablet Take 1 tablet (50 mg total) by mouth 2 (two) times daily. 12/27/15   Elson AreasLeslie K Sofia, PA-C   BP 116/76 mmHg  Pulse 77  Temp(Src) 98.8 F (37.1 C) (Oral)  Resp 18  SpO2 100%  LMP 12/25/2015 Physical Exam  Constitutional: She is oriented to person, place, and time. She appears well-developed and well-nourished.  HENT:  Head: Normocephalic.  Multiple broken decayed teeth  Eyes: EOM are normal.  Neck: Normal range of motion.  Pulmonary/Chest: Effort normal.  Abdominal: She exhibits no distension.  Musculoskeletal: Normal range of motion.  Neurological: She is alert and oriented to person, place, and time.  Skin: Skin is warm.  Psychiatric: She has a normal mood and affect.  Nursing note and vitals reviewed.   ED Course  Procedures (including critical care time) Labs Review Labs Reviewed - No data to display  Imaging Review No results found. I have personally reviewed and evaluated these images and lab results as part of my medical decision-making.   EKG Interpretation None      MDM   Final diagnoses:  Toothache  Midline low back pain without sciatica  An After Visit Summary was printed and given to the patient.  Current Meds  Medication Sig  . acetaminophen (TYLENOL) 500 MG tablet Take 500 mg by mouth every 6 (six) hours  as needed for mild pain or moderate pain.  Marland Kitchen ibuprofen (ADVIL,MOTRIN) 200 MG tablet Take 200 mg by mouth every 6 (six) hours as needed for fever, mild pain or moderate pain.     Lonia Skinner [place], PA-C 12/27/15 1835  Glynn Octave, MD 12/27/15 [phone]

## 2016-01-20 ENCOUNTER — Encounter (HOSPITAL_COMMUNITY): Payer: Self-pay | Admitting: *Deleted

## 2016-01-20 ENCOUNTER — Emergency Department (HOSPITAL_COMMUNITY)
Admission: EM | Admit: 2016-01-20 | Discharge: 2016-01-20 | Disposition: A | Discharge status: Home / Self Care | Payer: Self-pay | Attending: Emergency Medicine | Admitting: Emergency Medicine

## 2016-01-20 DIAGNOSIS — X501XXA Overexertion from prolonged static or awkward postures, initial encounter: Secondary | ICD-10-CM | POA: Insufficient documentation

## 2016-01-20 DIAGNOSIS — S39012A Strain of muscle, fascia and tendon of lower back, initial encounter: Secondary | ICD-10-CM | POA: Insufficient documentation

## 2016-01-20 DIAGNOSIS — Y9389 Activity, other specified: Secondary | ICD-10-CM | POA: Insufficient documentation

## 2016-01-20 DIAGNOSIS — Y999 Unspecified external cause status: Secondary | ICD-10-CM | POA: Insufficient documentation

## 2016-01-20 DIAGNOSIS — Y929 Unspecified place or not applicable: Secondary | ICD-10-CM | POA: Insufficient documentation

## 2016-01-20 MED ORDER — IBUPROFEN 800 MG PO TABS: 800.0000 mg | ORAL_TABLET | Freq: Three times a day (TID) | ORAL | Status: AC

## 2016-01-20 MED ORDER — CYCLOBENZAPRINE HCL 10 MG PO TABS: 10.0000 mg | ORAL_TABLET | Freq: Three times a day (TID) | ORAL | Status: AC | PRN

## 2016-01-20 NOTE — ED Notes (Signed)
Pt states she has been moving boxes for the last couple of days and now she is having mid-lower back pain.

## 2016-01-20 NOTE — ED Notes (Signed)
Pt walks heel to toe with relaxed facial features as well as laughing with other nurse

## 2016-01-20 NOTE — Discharge Instructions (Signed)

## 2016-01-22 NOTE — ED Provider Notes (Signed)
CSN: [id number]     Arrival date & time 01/20/16  2102 History   First MD Initiated Contact with Patient 01/20/16 2121     Chief Complaint  Patient presents with  . Back Pain     (Consider location/radiation/quality/duration/timing/severity/associated sxs/prior Treatment) HPI   Autumn Stewart is a 35 y.o. female who presents to the Emergency Department complaining of low back pain for 2 days.  Pain onset after moving heavy boxes.  She describes a throbbing pain associated with twisting and banding.  Pain improves at rest.  She has not taken any medications for symptom relief.  She denies fever, chills, abd pain, numbness or weakness of the extremities, urine or bowel changes.      History reviewed. No pertinent past medical history. Past Surgical History  Procedure Laterality Date  . Dental surgery     Family History  Problem Relation Age of Onset  . Diabetes Other   . Hypertension Other    Social History  Substance Use Topics  . Smoking status: Never Smoker   . Smokeless tobacco: Never Used  . Alcohol Use: No   OB History    No data available     Review of Systems  Constitutional: Negative for fever.  Respiratory: Negative for shortness of breath.   Gastrointestinal: Negative for vomiting, abdominal pain and constipation.  Genitourinary: Negative for dysuria, hematuria, flank pain, decreased urine volume and difficulty urinating.  Musculoskeletal: Positive for back pain. Negative for joint swelling.  Skin: Negative for rash.  Neurological: Negative for weakness and numbness.  All other systems reviewed and are negative.     Allergies  Review of patient's allergies indicates no known allergies.  Home Medications   Prior to Admission medications   Medication Sig Start Date End Date Taking? Authorizing Provider  acetaminophen (TYLENOL) 500 MG tablet Take 500 mg by mouth every 6 (six) hours as needed for mild pain or moderate pain.    Historical Provider, MD   amoxicillin (AMOXIL) 500 MG capsule Take 1 capsule (500 mg total) by mouth 3 (three) times daily. 12/27/15   Elson AreasLeslie K Sofia, PA-C  cyclobenzaprine (FLEXERIL) 10 MG tablet Take 1 tablet (10 mg total) by mouth 3 (three) times daily as needed. 01/20/16   Tyress Loden, PA-C  diclofenac (VOLTAREN) 50 MG EC tablet Take 1 tablet (50 mg total) by mouth 2 (two) times daily. 12/27/15   Elson AreasLeslie K Sofia, PA-C  ibuprofen (ADVIL,MOTRIN) 800 MG tablet Take 1 tablet (800 mg total) by mouth 3 (three) times daily. Take with food 01/20/16   Lulla Linville, PA-C   BP 112/78 mmHg  Pulse 68  Temp(Src) 98.6 F (37 C) (Oral)  Resp 18  Ht 5\' 4"  (1.626 m)  Wt 48.081 kg  BMI 18.19 kg/m2  SpO2 100%  LMP 12/25/2015 Physical Exam  Constitutional: She is oriented to person, place, and time. She appears well-developed and well-nourished. No distress.  HENT:  Head: Normocephalic and atraumatic.  Neck: Normal range of motion. Neck supple.  Cardiovascular: Normal rate, regular rhythm, normal heart sounds and intact distal pulses.   No murmur heard. Pulmonary/Chest: Effort normal and breath sounds normal. No respiratory distress.  Abdominal: Soft. She exhibits no distension. There is no tenderness.  Musculoskeletal: She exhibits tenderness. She exhibits no edema.       Lumbar back: She exhibits tenderness and pain. She exhibits normal range of motion, no swelling, no deformity, no laceration and normal pulse.  ttp of the bilateral lumbar paraspinal muscles.  No spinal tenderness or step offs.  DP pulses are brisk and symmetrical.  Distal sensation intact.  Pt has 5/5 strength against resistance of bilateral lower extremities.     Neurological: She is alert and oriented to person, place, and time. She has normal strength. No sensory deficit. She exhibits normal muscle tone. Coordination and gait normal.  Reflex Scores:      Patellar reflexes are 2+ on the right side and 2+ on the left side.      Achilles reflexes are 2+ on  the right side and 2+ on the left side. Skin: Skin is warm and dry. No rash noted.  Nursing note and vitals reviewed.   ED Course  Procedures (including critical care time) Labs Review Labs Reviewed - No data to display  Imaging Review No results found. I have personally reviewed and evaluated these images and lab results as part of my medical decision-making.   EKG Interpretation None      MDM   Final diagnoses:  Lumbar strain, initial encounter    Pt well appearing.  Ambulates with a steady gait.  No focal neuro deficits.  Likely muscular. No trauma or spinal tenderness to indicate need for imaging.  Pt stable for d/c and agrees to PMD f/u in one week if not improving with symptomatic tx.      Pauline Aus, PA-C 01/22/16 1715  Donnetta Hutching, MD 01/22/16 [phone]

## 2016-11-01 ENCOUNTER — Emergency Department (HOSPITAL_COMMUNITY): Payer: Self-pay

## 2016-11-01 ENCOUNTER — Encounter (HOSPITAL_COMMUNITY): Payer: Self-pay | Admitting: Emergency Medicine

## 2016-11-01 ENCOUNTER — Emergency Department (HOSPITAL_COMMUNITY)
Admission: EM | Admit: 2016-11-01 | Discharge: 2016-11-01 | Disposition: A | Discharge status: Home / Self Care | Payer: Self-pay | Attending: Emergency Medicine | Admitting: Emergency Medicine

## 2016-11-01 DIAGNOSIS — Z791 Long term (current) use of non-steroidal anti-inflammatories (NSAID): Secondary | ICD-10-CM | POA: Insufficient documentation

## 2016-11-01 DIAGNOSIS — Y929 Unspecified place or not applicable: Secondary | ICD-10-CM | POA: Insufficient documentation

## 2016-11-01 DIAGNOSIS — Y999 Unspecified external cause status: Secondary | ICD-10-CM | POA: Insufficient documentation

## 2016-11-01 DIAGNOSIS — Z79899 Other long term (current) drug therapy: Secondary | ICD-10-CM | POA: Insufficient documentation

## 2016-11-01 DIAGNOSIS — S63697A Other sprain of left little finger, initial encounter: Secondary | ICD-10-CM | POA: Insufficient documentation

## 2016-11-01 DIAGNOSIS — X58XXXA Exposure to other specified factors, initial encounter: Secondary | ICD-10-CM | POA: Insufficient documentation

## 2016-11-01 DIAGNOSIS — M25512 Pain in left shoulder: Secondary | ICD-10-CM | POA: Insufficient documentation

## 2016-11-01 DIAGNOSIS — Y939 Activity, unspecified: Secondary | ICD-10-CM | POA: Insufficient documentation

## 2016-11-01 NOTE — ED Provider Notes (Signed)
[facility] Provider Note   CSN: [id number] Arrival date & time: 11/01/16  1858     History   Chief Complaint Chief Complaint  Patient presents with  . Shoulder Pain    HPI Autumn Stewart is a 36 y.o. female.  The history is provided by the patient.  Shoulder Pain   This is a new problem. The current episode started more than 2 days ago. The problem occurs daily. The problem has not changed since onset.The pain is present in the left shoulder. The pain is mild. Associated symptoms include stiffness. She has tried nothing for the symptoms. There has been no history of extremity trauma.    History reviewed. No pertinent past medical history.  Patient Active Problem List   Diagnosis Date Noted  . Palpitations 06/23/2014  . PVC's (premature ventricular contractions) 06/23/2014    Past Surgical History:  Procedure Laterality Date  . DENTAL SURGERY      OB History    No data available       Home Medications    Prior to Admission medications   Medication Sig Start Date End Date Taking? Authorizing Provider  acetaminophen (TYLENOL) 500 MG tablet Take 500 mg by mouth every 6 (six) hours as needed for mild pain or moderate pain.    Historical Provider, MD  amoxicillin (AMOXIL) 500 MG capsule Take 1 capsule (500 mg total) by mouth 3 (three) times daily. 12/27/15   Elson Areas, PA-C  cyclobenzaprine (FLEXERIL) 10 MG tablet Take 1 tablet (10 mg total) by mouth 3 (three) times daily as needed. 01/20/16   Tammy Triplett, PA-C  diclofenac (VOLTAREN) 50 MG EC tablet Take 1 tablet (50 mg total) by mouth 2 (two) times daily. 12/27/15   Elson Areas, PA-C  ibuprofen (ADVIL,MOTRIN) 800 MG tablet Take 1 tablet (800 mg total) by mouth 3 (three) times daily. Take with food 01/20/16   Pauline Aus, PA-C    Family History Family History  Problem Relation Age of Onset  . Diabetes Other   . Hypertension Other     Social History Social History  Substance Use Topics  .  Smoking status: Never Smoker  . Smokeless tobacco: Never Used  . Alcohol use No     Allergies   Patient has no known allergies.   Review of Systems Review of Systems  Constitutional: Negative for activity change.       All ROS Neg except as noted in HPI  HENT: Negative for nosebleeds.   Eyes: Negative for photophobia and discharge.  Respiratory: Negative for cough, shortness of breath and wheezing.   Cardiovascular: Negative for chest pain and palpitations.  Gastrointestinal: Negative for abdominal pain and blood in stool.  Genitourinary: Negative for dysuria, frequency and hematuria.  Musculoskeletal: Positive for arthralgias and stiffness. Negative for back pain and neck pain.  Skin: Negative.   Neurological: Negative for dizziness, seizures and speech difficulty.  Psychiatric/Behavioral: Negative for confusion and hallucinations.     Physical Exam Updated Vital Signs BP 115/81 (BP Location: Right Arm)   Pulse 70   Temp 98.8 F (37.1 C) (Oral)   Resp 18   Ht  (1.626 m)   Wt 53.1 kg   LMP 10/07/2016   SpO2 100%   BMI 20.08 kg/m   Physical Exam  Constitutional: She is oriented to person, place, and time. She appears well-developed and well-nourished.  Non-toxic appearance.  HENT:  Head: Normocephalic.  Right Ear: Tympanic membrane and external ear normal.  Left Ear: Tympanic membrane and external ear normal.  Eyes: EOM and lids are normal. Pupils are equal, round, and reactive to light.  Neck: Normal range of motion. Neck supple. Carotid bruit is not present.  Cardiovascular: Normal rate, regular rhythm, normal heart sounds, intact distal pulses and normal pulses.   Pulmonary/Chest: Breath sounds normal. No respiratory distress.  Abdominal: Soft. Bowel sounds are normal. There is no tenderness. There is no guarding.  Musculoskeletal:       Left shoulder: She exhibits decreased range of motion. She exhibits no swelling, no deformity and normal strength.        Right hand: She exhibits tenderness and deformity.       Hands: Lymphadenopathy:       Head (right side): No submandibular adenopathy present.       Head (left side): No submandibular adenopathy present.    She has no cervical adenopathy.  Neurological: She is alert and oriented to person, place, and time. She has normal strength. No cranial nerve deficit or sensory deficit.  Skin: Skin is warm and dry.  Psychiatric: She has a normal mood and affect. Her speech is normal.  Nursing note and vitals reviewed.    ED Treatments / Results  Labs (all labs ordered are listed, but only abnormal results are displayed) Labs Reviewed - No data to display  EKG  EKG Interpretation None       Radiology Dg Shoulder Left  Result Date: 11/01/2016 CLINICAL DATA:  Acute left shoulder pain without known injury. EXAM: LEFT SHOULDER - 2+ VIEW COMPARISON:  None. FINDINGS: There is no evidence of fracture or dislocation. There is no evidence of arthropathy or other focal bone abnormality. Soft tissues are unremarkable. IMPRESSION: Normal left shoulder. Electronically Signed   By: Lupita Raider, M.D.   On: 11/01/2016 20:40   Dg Finger Little Right  Result Date: 11/01/2016 CLINICAL DATA:  Injury to the little finger swelling at the PIP joint EXAM: RIGHT LITTLE FINGER 2+V COMPARISON:  None. FINDINGS: There is no fracture identified. Slight deviation of the distal digit at the PIP joint in an ulnar direction but no dislocation. Soft tissue swelling at the PIP joint. No radiopaque foreign body. IMPRESSION: Soft tissue swelling at the PIP joint. No fracture or definitive dislocation. Electronically Signed   By: Jasmine Pang M.D.   On: 11/01/2016 20:41    Procedures Procedures (including critical care time)  Medications Ordered in ED Medications - No data to display   Initial Impression / Assessment and Plan / ED Course  I have reviewed the triage vital signs and the nursing notes.  Pertinent labs &  imaging results that were available during my care of the patient were reviewed by me and considered in my medical decision making (see chart for details).     **I have reviewed nursing notes, vital signs, and all appropriate lab and imaging results for this patient.*  Final Clinical Impressions(s) / ED Diagnoses MDM Vital signs within normal limits. There no gross neurologic deficits appreciated on examination. There no vascular deficits appreciated on examination. X-ray of the left shoulder is negative for acute fracture or dislocation or other issues. X-ray of the right little finger shows slight deviation at the PIP joint in an ulnar direction, but there is no dislocation appreciated. Is also some soft tissue swelling at the PIP joint.  Suspect shoulder pain related to overuse. Suspect right little finger pain related to sprain, or old injury. The patient is referred to  orthopedics at this time. The patient will use Tylenol every 4 hours, or ibuprofen every 6 hours for discomfort. Questions were answered. Patient is in agreement with this plan.    Final diagnoses:  Left shoulder pain, unspecified chronicity  Other sprain of left little finger, initial encounter    New Prescriptions New Prescriptions   No medications on file     Ivery Quale, PA-C 11/02/16 1130    Lavera Guise, MD 11/02/16 1220

## 2016-11-01 NOTE — Discharge Instructions (Signed)
Please continue warm Epsom salt soaks. Use Tylenol every 4 hours, or ibuprofen every 6 hours. Please see Dr. Sudie Bailey for additional evaluation if not improving. Your x-rays are negative for fracture or dislocation.

## 2016-11-01 NOTE — ED Triage Notes (Addendum)
Patient complaining of left shoulder pain with movement since yesterday. Denies injury. Patient also complains right pinky finger pain from injury x 1 week ago. Swelling noted to right pinky finger.

## 2016-12-10 ENCOUNTER — Emergency Department (HOSPITAL_COMMUNITY): Payer: Self-pay

## 2016-12-10 ENCOUNTER — Emergency Department (HOSPITAL_COMMUNITY)
Admission: EM | Admit: 2016-12-10 | Discharge: 2016-12-10 | Disposition: A | Discharge status: Home / Self Care | Payer: Self-pay

## 2016-12-10 ENCOUNTER — Encounter (HOSPITAL_COMMUNITY): Payer: Self-pay | Admitting: Emergency Medicine

## 2016-12-10 ENCOUNTER — Emergency Department (HOSPITAL_COMMUNITY)
Admission: EM | Admit: 2016-12-10 | Discharge: 2016-12-10 | Disposition: A | Discharge status: Home / Self Care | Payer: Self-pay | Attending: Emergency Medicine | Admitting: Emergency Medicine

## 2016-12-10 DIAGNOSIS — M25511 Pain in right shoulder: Secondary | ICD-10-CM | POA: Insufficient documentation

## 2016-12-10 MED ORDER — MELOXICAM 15 MG PO TABS: 15.0000 mg | ORAL_TABLET | Freq: Every day | ORAL | 0 refills | Status: AC

## 2016-12-10 NOTE — ED Triage Notes (Signed)
Pt c/o right shoulder pain and has been seen here for the same. Pt states she needs note for work on lifting restrictions.

## 2016-12-10 NOTE — Discharge Instructions (Signed)
Contact a health care provider if: °Your pain is getting worse. °Your pain is not relieved with medicines. °You lose function in the area of the pain if the pain is in your arms, legs, or neck. °

## 2016-12-10 NOTE — ED Provider Notes (Signed)
[facility] Provider Note   CSN: [id number] Arrival date & time: 12/10/16  2022     History   Chief Complaint Chief Complaint  Patient presents with  . Shoulder Pain    HPI Autumn Stewart is a 36 y.o. female.He presents emergency Department with chief complaint of posterior right shoulder pain. Patient states that she has pain in the right upper shoulder blade region. 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 She states that the pain is worse with movement of the shoulder blade and palpation. She denies any weakness, numbness in the extremity.  HPI  History reviewed. No pertinent past medical history.  Patient Active Problem List   Diagnosis Date Noted  . Palpitations 06/23/2014  . PVC's (premature ventricular contractions) 06/23/2014    Past Surgical History:  Procedure Laterality Date  . DENTAL SURGERY      OB History    No data available       Home Medications    Prior to Admission medications   Medication Sig Start Date End Date Taking? Authorizing Provider  acetaminophen (TYLENOL) 500 MG tablet Take 500 mg by mouth every 6 (six) hours as needed for mild pain or moderate pain.    [provider]  amoxicillin (AMOXIL) 500 MG capsule Take 1 capsule (500 mg total) by mouth 3 (three) times daily. 12/27/15   Elson AreasSofia, Leslie K, PA-C  cyclobenzaprine (FLEXERIL) 10 MG tablet Take 1 tablet (10 mg total) by mouth 3 (three) times daily as needed. 01/20/16   Triplett, Tammy, PA-C  diclofenac (VOLTAREN) 50 MG EC tablet Take 1 tablet (50 mg total) by mouth 2 (two) times daily. 12/27/15   Elson AreasSofia, Leslie K, PA-C  ibuprofen (ADVIL,MOTRIN) 800 MG tablet Take 1 tablet (800 mg total) by mouth 3 (three) times daily. Take with food 01/20/16   Triplett, Tammy, PA-C  meloxicam (MOBIC) 15 MG tablet Take 1 tablet (15 mg total) by mouth daily. Take 1 daily with food. 12/10/16   Arthor CaptainHarris, Arriah Wadle, PA-C    Family  History Family History  Problem Relation Age of Onset  . Diabetes Other   . Hypertension Other     Social History Social History  Substance Use Topics  . Smoking status: Never Smoker  . Smokeless tobacco: Never Used  . Alcohol use No     Allergies   Patient has no known allergies.   Review of Systems Review of Systems  Ten systems reviewed and are negative for acute change, except as noted in the HPI.   Physical Exam Updated Vital Signs BP 115/89   Pulse 68   Temp 98.1 F (36.7 C)   Resp 18   Ht 5\' 6"  (1.676 m)   Wt 49.9 kg (110 lb)   LMP 12/03/2016   SpO2 100%   BMI 17.75 kg/m   Physical Exam  Constitutional: She is oriented to person, place, and time. She appears well-developed and well-nourished. No distress.  HENT:  Head: Normocephalic and atraumatic.  Eyes: Conjunctivae are normal. No scleral icterus.  Neck: Normal range of motion.  Cardiovascular: Normal rate, regular rhythm and normal heart sounds.  Exam reveals no gallop and no friction rub.   No murmur heard. Pulmonary/Chest: Effort normal and breath sounds normal. No respiratory distress.  Abdominal: Soft. Bowel sounds are normal. She exhibits no distension and no mass. There is no tenderness. There is no guarding.  Musculoskeletal:  Range  of motion of the right shoulder. Multiple tender  Trigger points in the shoulder fluid region on the right side. Full range of motion of the neck. Normal upper extremity strength, neurovascularly intact.  Neurological: She is alert and oriented to person, place, and time.  Skin: Skin is warm and dry. She is not diaphoretic.  Psychiatric: Her behavior is normal.  Nursing note and vitals reviewed.    ED Treatments / Results  Labs (all labs ordered are listed, but only abnormal results are displayed) Labs Reviewed - No data to display  EKG  EKG Interpretation None       Radiology Dg Shoulder Right  Result Date: 12/10/2016 CLINICAL DATA:  Right  shoulder pain for several weeks. No known injury. EXAM: RIGHT SHOULDER - 2+ VIEW COMPARISON:  None. FINDINGS: There is no evidence of fracture or dislocation. There is no evidence of arthropathy or other focal bone abnormality. Soft tissues are unremarkable. IMPRESSION: Normal examination. Electronically Signed   By: Beckie Salts M.D.   On: 12/10/2016 21:20    Procedures Procedures (including critical care time)  Medications Ordered in ED Medications - No data to display   Initial Impression / Assessment and Plan / ED Course  I have reviewed the triage vital signs and the nursing notes.  Pertinent labs & imaging results that were available during my care of the patient were reviewed by me and considered in my medical decision making (see chart for details).      Patient with active trigger points to the right upper shoulder blade. She will be discharged mobile. Given her light duty at work for the next 4 days. Suggested orthopedic follow-up. Discussed return precautions. She appears safe for discharge at this time  Final Clinical Impressions(s) / ED Diagnoses   Final diagnoses:  Trigger point of right shoulder region    New Prescriptions Discharge Medication List as of 12/10/2016 10:25 PM    START taking these medications   Details  meloxicam (MOBIC) 15 MG tablet Take 1 tablet (15 mg total) by mouth daily. Take 1 daily with food., Starting Mon 12/10/2016, Print         Krithi Bray, [place], PA-C 12/11/16 0139    Devoria Albe, MD 12/11/16 [phone]

## 2016-12-10 NOTE — ED Notes (Signed)
Pt called from the waiting room 3 times with no answer. Registration states patient is not in the waiting room.

## 2016-12-10 NOTE — ED Notes (Signed)
Pt states that she did not hear her ane when it was called.

## 2017-12-31 IMAGING — DX DG FINGER LITTLE 2+V*R*
3 series · 3 of 3 positions shown · non-contrast
Comparison: None.

CLINICAL DATA: Injury to the little finger swelling at the PIP
joint

EXAM:
RIGHT LITTLE FINGER 2+V

[finger ap]
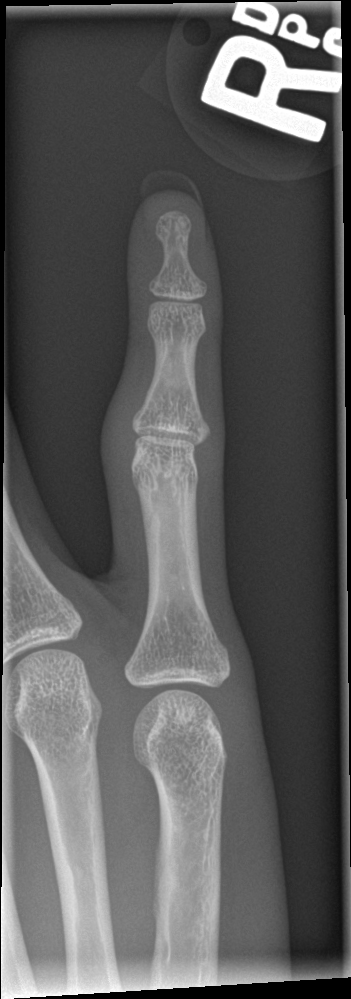

[finger obl]
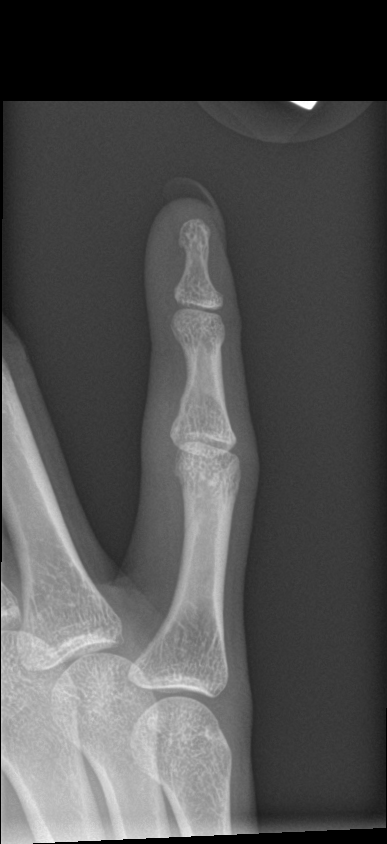

[finger lat]
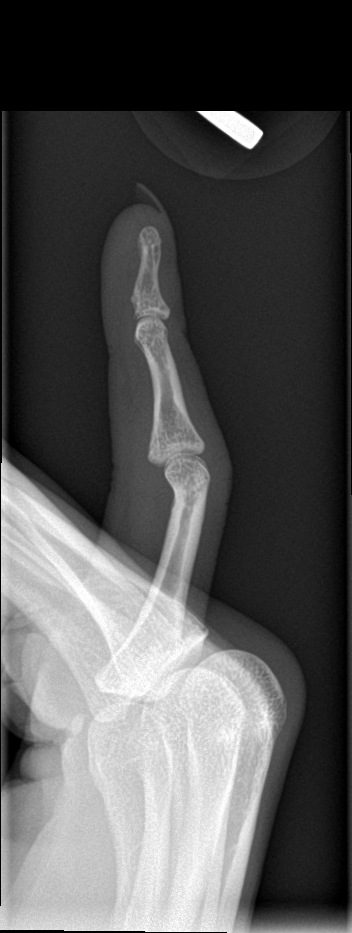

[3 of 3 positions shown; findings below may reference images not displayed]

FINDINGS: There is no fracture identified. Slight deviation of the distal
digit at the PIP joint in an ulnar direction but no dislocation.
Soft tissue swelling at the PIP joint. No radiopaque foreign body.
IMPRESSION: Soft tissue swelling at the PIP joint. No fracture or definitive
dislocation.

## 2018-02-08 IMAGING — DX DG SHOULDER 2+V*R*
3 series · 3 of 3 positions shown · non-contrast
Comparison: None.

CLINICAL DATA: Right shoulder pain for several weeks. No known
injury.

EXAM:
RIGHT SHOULDER - 2+ VIEW

[shoulder grashey]
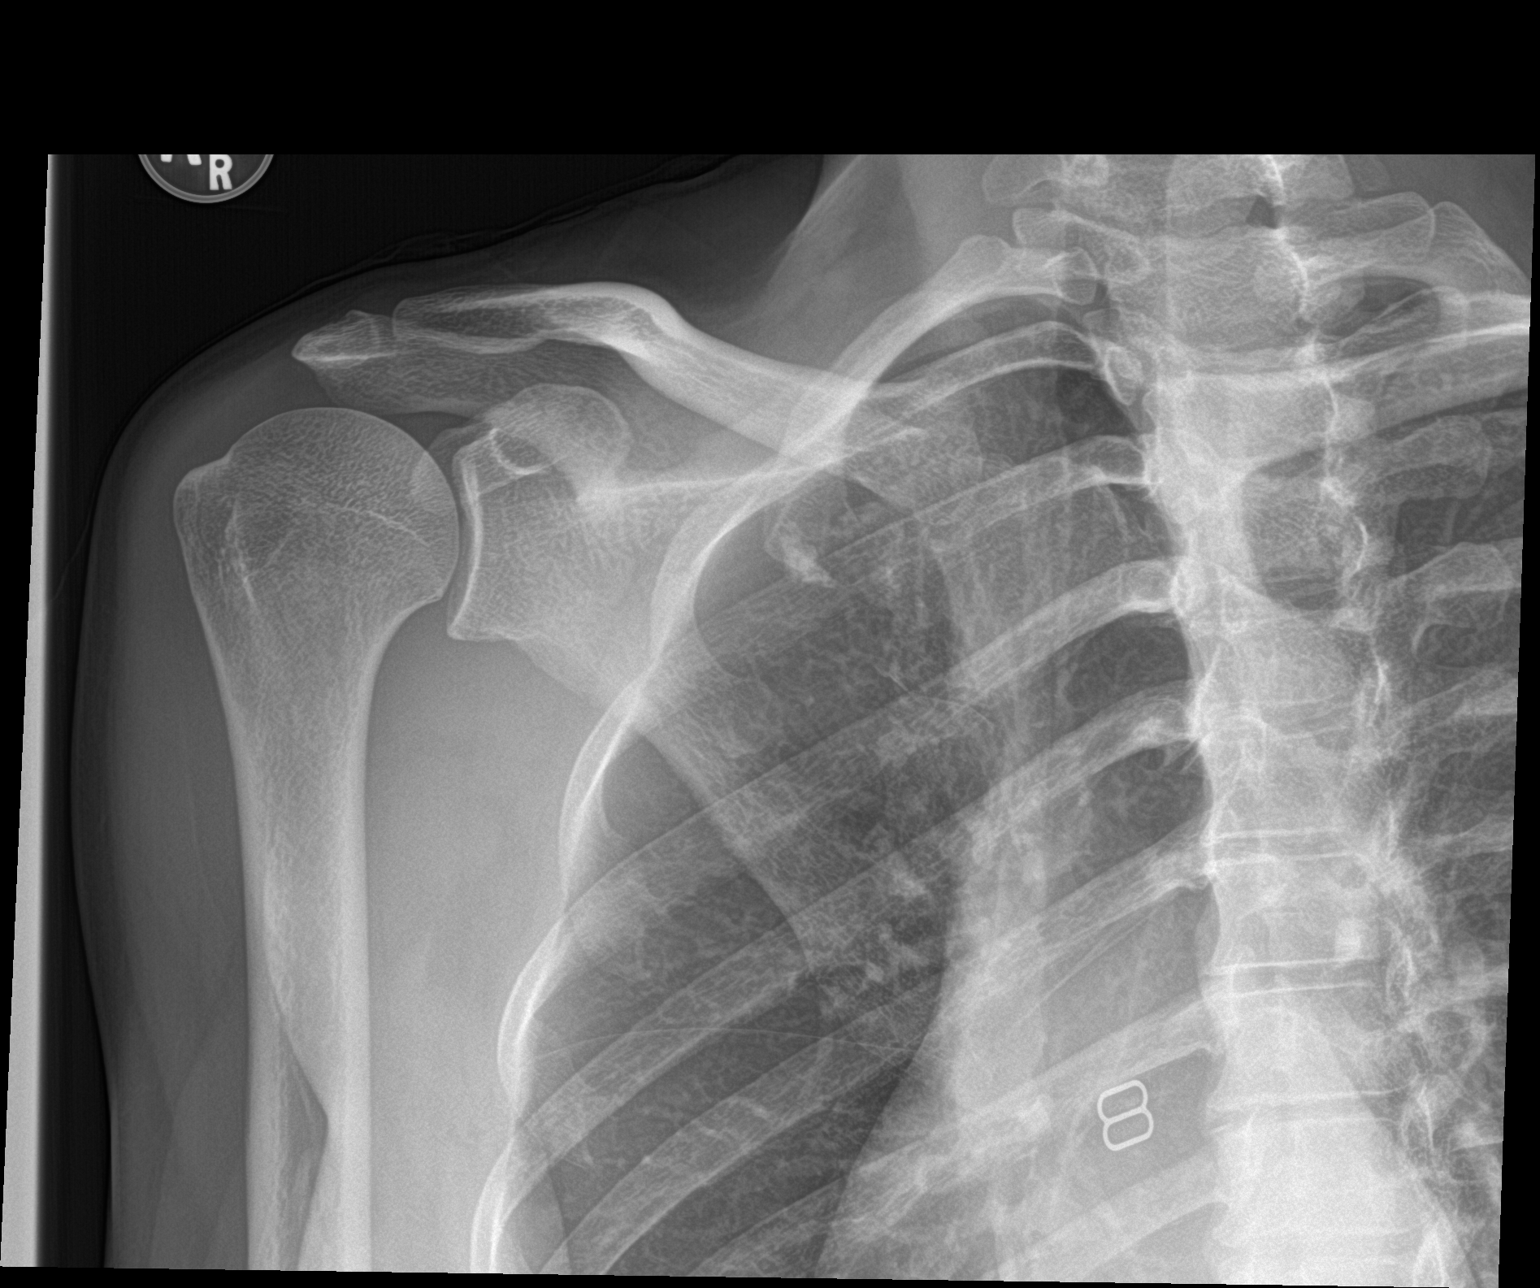

[shoulder y view]
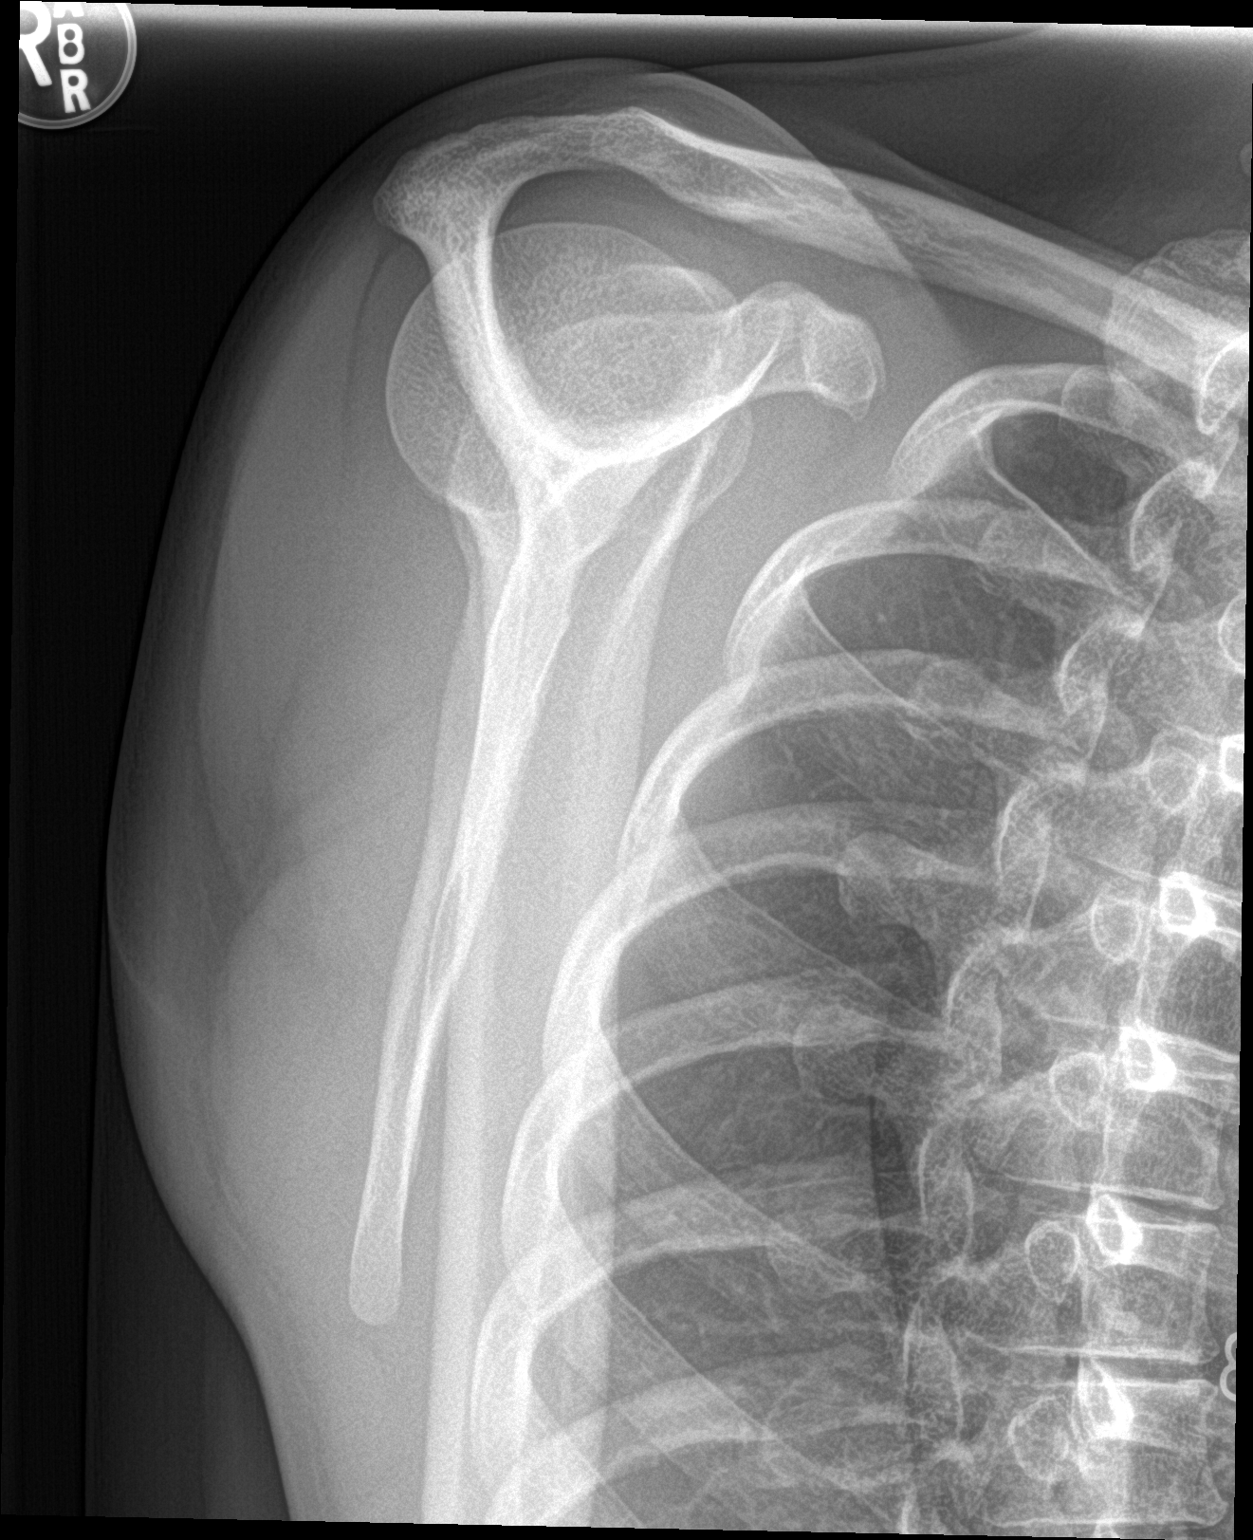

[shoulder axillary]
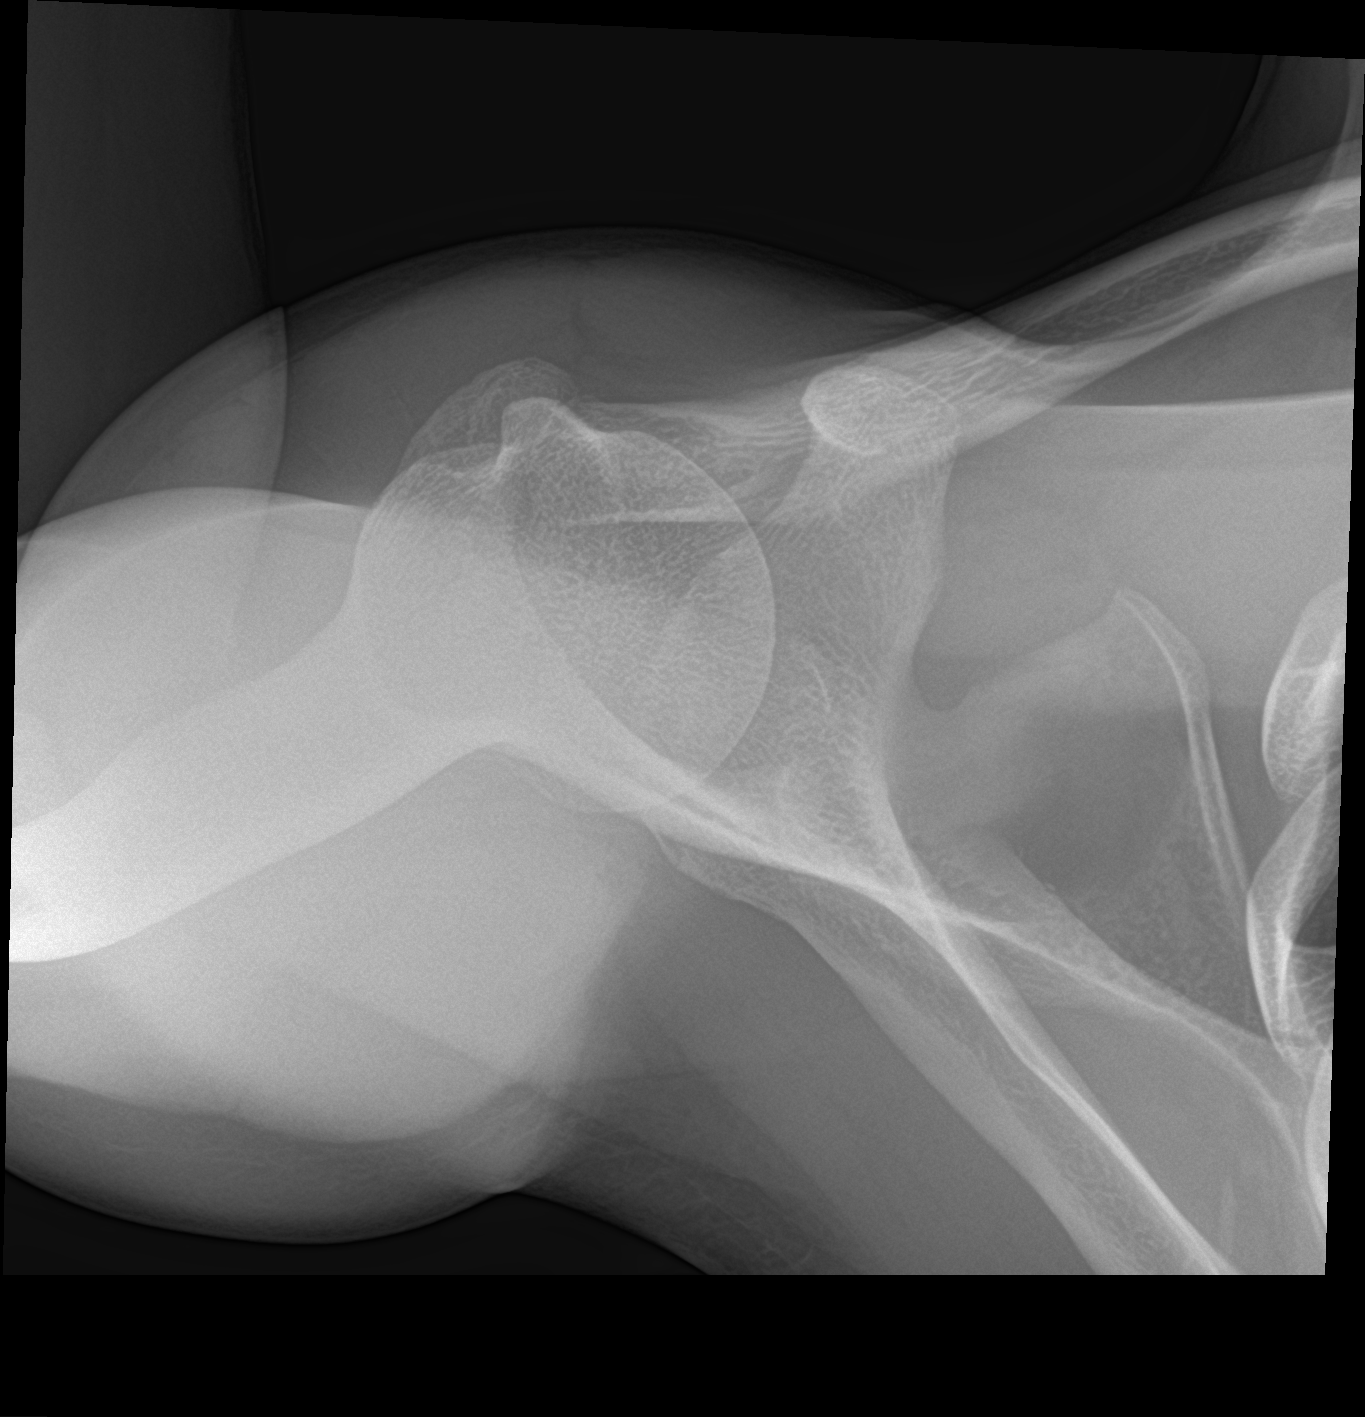

[3 of 3 positions shown; findings below may reference images not displayed]

FINDINGS: There is no evidence of fracture or dislocation. There is no
evidence of arthropathy or other focal bone abnormality. Soft
tissues are unremarkable.
IMPRESSION: Normal examination.
